# Patient Record
Sex: Male | Born: 1950 | Race: White | Hispanic: No | Marital: Married | Smoking: Never smoker
Health system: Southern US, Community
[De-identification: ages and names within clinical notes are randomized; demographics above are authoritative.]

## PROBLEM LIST (undated history)

## (undated) HISTORY — PX: WRIST SURGERY: SHX841

## (undated) HISTORY — PX: KNEE SURGERY: SHX244

---

## 2008-05-05 ENCOUNTER — Ambulatory Visit (HOSPITAL_BASED_OUTPATIENT_CLINIC_OR_DEPARTMENT_OTHER): Admission: RE | Admit: 2008-05-05 | Discharge: 2008-05-05 | Payer: Self-pay | Admitting: Orthopedic Surgery

## 2008-05-05 ENCOUNTER — Ambulatory Visit: Payer: Self-pay | Admitting: Diagnostic Radiology

## 2008-05-12 ENCOUNTER — Ambulatory Visit (HOSPITAL_BASED_OUTPATIENT_CLINIC_OR_DEPARTMENT_OTHER): Admission: RE | Admit: 2008-05-12 | Discharge: 2008-05-12 | Payer: Self-pay | Admitting: Orthopedic Surgery

## 2008-05-12 ENCOUNTER — Ambulatory Visit: Payer: Self-pay | Admitting: Diagnostic Radiology

## 2013-01-13 ENCOUNTER — Ambulatory Visit (INDEPENDENT_AMBULATORY_CARE_PROVIDER_SITE_OTHER): Payer: BC Managed Care – PPO | Admitting: Family Medicine

## 2013-01-13 ENCOUNTER — Encounter: Payer: Self-pay | Admitting: Family Medicine

## 2013-01-13 ENCOUNTER — Ambulatory Visit (HOSPITAL_BASED_OUTPATIENT_CLINIC_OR_DEPARTMENT_OTHER)
Admission: RE | Admit: 2013-01-13 | Discharge: 2013-01-13 | Disposition: A | Discharge status: Home / Self Care | Payer: BC Managed Care – PPO | Source: Ambulatory Visit | Attending: Family Medicine | Admitting: Family Medicine

## 2013-01-13 VITALS — BP 113/75 | HR 79 | Ht 72.0 in | Wt 178.0 lb

## 2013-01-13 DIAGNOSIS — M25519 Pain in unspecified shoulder: Secondary | ICD-10-CM

## 2013-01-13 DIAGNOSIS — M25511 Pain in right shoulder: Secondary | ICD-10-CM

## 2013-01-13 NOTE — Patient Instructions (Addendum)
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 Take tylenol 500mg  1-2 tabs three times a day for pain. Aleve 1-2 tabs twice a day with food OR ibuprofen 600mg  three times a day with food as needed for pain, inflammation. Glucosamine sulfate 750mg  twice a day is a supplement that may help - you take this already. Capsaicin or biofreeze topically up to four times a day may also help with pain. Cortisone injections are an option - let me know if you want to go ahead with one of these. It's important that you continue to stay active. Consider physical therapy to strengthen muscles around the joint that hurts to take pressure off of the joint itself - this is typically not very helpful for the [facility] joint though. Heat or ice 15 minutes at a time 3-4 times a day as needed to help with pain. Follow up with me as needed.

## 2013-01-14 ENCOUNTER — Encounter: Payer: Self-pay | Admitting: Family Medicine

## 2013-01-14 DIAGNOSIS — M25511 Pain in right shoulder: Secondary | ICD-10-CM | POA: Insufficient documentation

## 2013-01-14 NOTE — Progress Notes (Signed)
Patient ID: Dillon Ward, male   DOB: 04-23-1950, 62 y.o.   MRN: [id number]  PCP: No primary provider on file.  Subjective:   HPI: Patient is a 62 y.o. male here for right shoulder pain.  Patient believes pain started back in June when he was swinging an axe. Worsened following playing golf. Sore and feels distended at point of shoulder superiorly. No pain while he was playing golf though. No night pain. No prior shoulder issues. Is right handed. Doesn't do overhead activities including bench press, [facility].  History reviewed. No pertinent past medical history.  No current outpatient prescriptions on file prior to visit.   No current facility-administered medications on file prior to visit.    Past Surgical History  Procedure Laterality Date  . Knee surgery Right     arthroscopy  . Wrist surgery Left     No Known Allergies  History   Social History  . Marital Status: Married    Spouse Name: N/A    Number of Children: N/A  . Years of Education: N/A   Occupational History  . Not on file.   Social History Main Topics  . Smoking status: Never Smoker   . Smokeless tobacco: Not on file  . Alcohol Use: Not on file  . Drug Use: Not on file  . Sexual Activity: Not on file   Other Topics Concern  . Not on file   Social History Narrative  . No narrative on file    Family History  Problem Relation Age of Onset  . Diabetes Mother   . Hypertension Mother   . Hypertension Father   . Diabetes Sister   . Hyperlipidemia Sister   . Heart attack Neg Hx   . Sudden death Neg Hx     BP 113/75  Pulse 79  Ht 6' (1.829 m)  Wt 178 lb (80.74 kg)  BMI 24.14 kg/m2  Review of Systems: See HPI above.    Objective:  Physical Exam:  Gen: NAD  Right shoulder: Focal bony prominence at [facility] joint/possibly just medial to this.  No other swelling, bruising deformity. Mild TTP AC joint, distal clavicle.  No other TTP. FROM. Negative Hawkins, Neers. Negative  Speeds, Yergasons. Strength 5/5 with empty can and resisted internal/external rotation. Negative apprehension. Pain with crossover adduction. NV intact distally.    Assessment & Plan:  1. Right shoulder pain - radiographs show spur/AC DJD but otherwise no acute findings.  No evidence prior high grade AC sprain, fracture.  Discussed options.  He would like to do conservative management.  Takes glucosamine.  Discussed capsaicin, tylenol, nsiads.  Icing as needed.  Relative rest.  Consider injection if pain becomes severe enough.  Otherwise f/u prn.

## 2013-01-14 NOTE — Assessment & Plan Note (Signed)
radiographs show spur/AC DJD but otherwise no acute findings.  No evidence prior high grade AC sprain, fracture.  Discussed options.  He would like to do conservative management.  Takes glucosamine.  Discussed capsaicin, tylenol, nsiads.  Icing as needed.  Relative rest.  Consider injection if pain becomes severe enough.  Otherwise f/u prn.

## 2014-04-14 IMAGING — CR DG SHOULDER 2+V*R*
3 series · 3 of 3 positions shown · non-contrast
Comparison: None.

CLINICAL DATA: Pain and swelling following exertion

EXAM:
RIGHT SHOULDER - 2+ VIEW

[w shoulder ap internal righ]
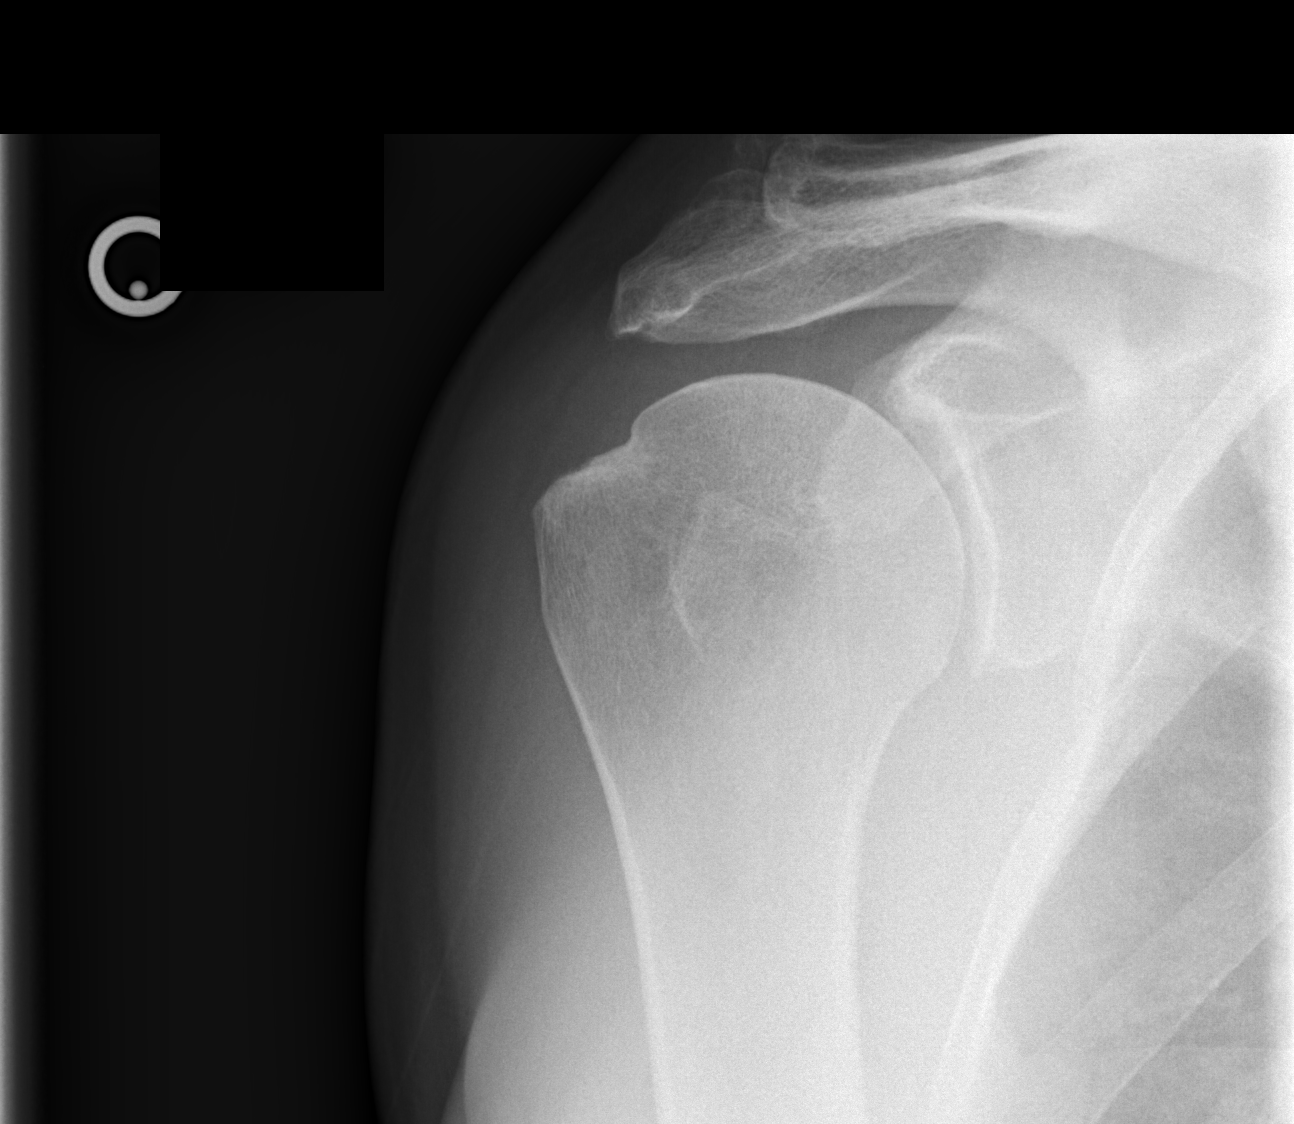

[w shoulder y view right]
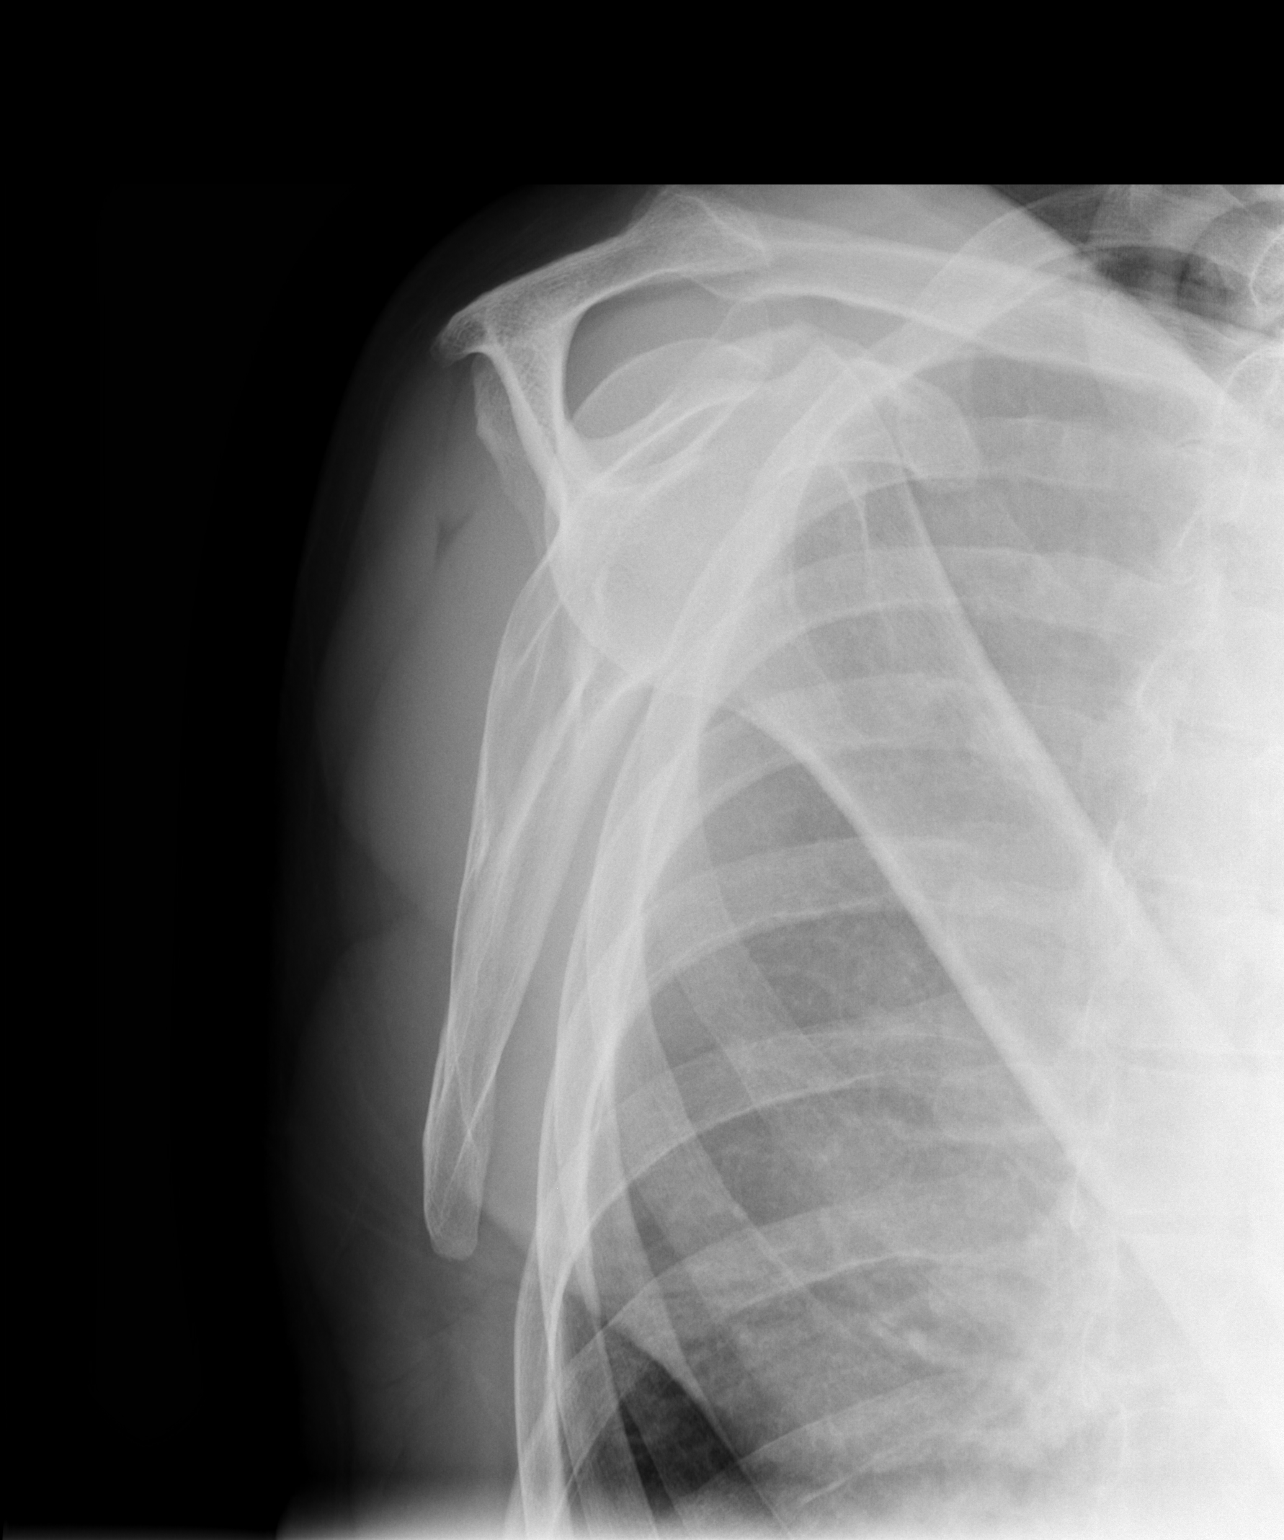

[x shoulder axillary right]
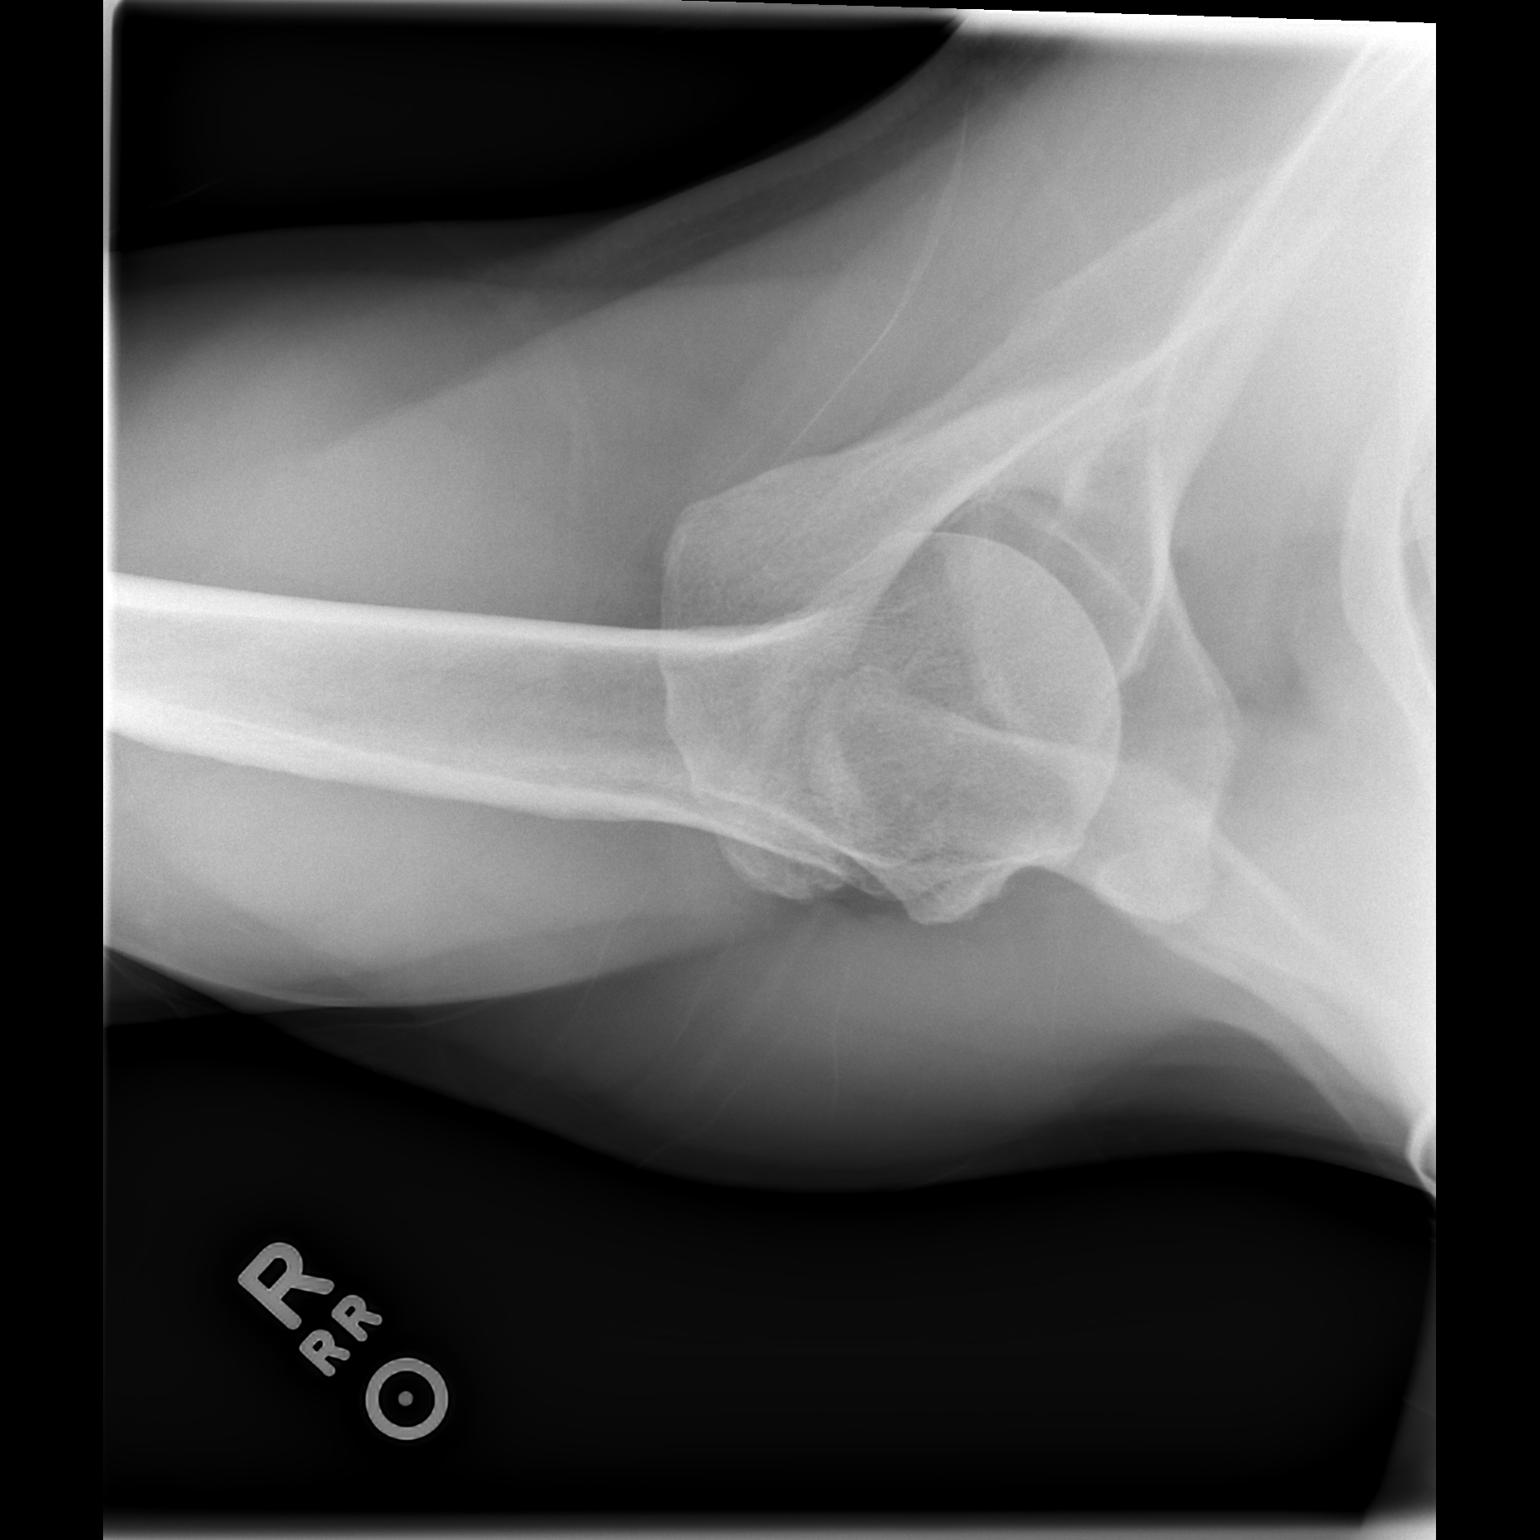

[3 of 3 positions shown; findings below may reference images not displayed]

FINDINGS: No acute fracture or dislocation is noted. Mild degenerative changes
of the acromioclavicular joint are seen. The visualized bony thorax
is unremarkable.
IMPRESSION: Mild degenerative change without acute abnormality.

## 2020-04-26 ENCOUNTER — Other Ambulatory Visit: Payer: Self-pay

## 2020-04-26 ENCOUNTER — Encounter: Payer: Self-pay | Admitting: Physical Therapy

## 2020-04-26 ENCOUNTER — Ambulatory Visit: Payer: Medicare HMO | Attending: Physician Assistant | Admitting: Physical Therapy

## 2020-04-26 DIAGNOSIS — M5442 Lumbago with sciatica, left side: Secondary | ICD-10-CM | POA: Diagnosis present

## 2020-04-26 DIAGNOSIS — R262 Difficulty in walking, not elsewhere classified: Secondary | ICD-10-CM | POA: Diagnosis present

## 2020-04-26 DIAGNOSIS — M25662 Stiffness of left knee, not elsewhere classified: Secondary | ICD-10-CM | POA: Insufficient documentation

## 2020-04-26 DIAGNOSIS — M25562 Pain in left knee: Secondary | ICD-10-CM | POA: Insufficient documentation

## 2020-04-26 NOTE — Therapy (Signed)
[facility] [address] [place], [place], [address] Phone: [phone]   Fax:  [phone]  Physical Therapy Evaluation  Patient Details  Name: Dillon Ward MRN: [id number] Date of Birth: 03/01/1951 Referring Provider (PT): Chabon   Encounter Date: 04/26/2020   PT End of Session - 04/26/20 1046    Visit Number 1    Date for PT Re-Evaluation 06/24/20    PT Start Time 1002    PT Stop Time 1043    PT Time Calculation (min) 41 min    Activity Tolerance Patient tolerated treatment well           History reviewed. No pertinent past medical history.  Past Surgical History:  Procedure Laterality Date  . KNEE SURGERY Right    arthroscopy  . WRIST SURGERY Left     There were no vitals filed for this visit.    Subjective Assessment - 04/26/20 1006    Subjective Patient reports that he has had some knee issues from playing basketball.  Reports that a few years ago he had an issue with the left knee where it would not straighten out.  He reports that he has difficulty straightening the knee, reports issues recently with activity.  X-rays showed OA.  He reports recently he was using post hole diggers and caused back pain and hip pain and this has made the knee pain a little worse.    Limitations Lifting;Walking;House hold activities    Patient Stated Goals better ROM, less pain, easier movements    Currently in Pain? Yes    Pain Score 4     Pain Location Back    Pain Orientation Lower;Left    Pain Descriptors / Indicators Aching;Sore    Pain Type Acute pain    Pain Radiating Towards has pain in the lateral anterior left thigh reports some tingling    Pain Onset More than a month ago    Pain Frequency Intermittent    Aggravating Factors  in the morning, twisint motions, lifting, pain up to 7/10    Pain Relieving Factors Aleve, rest, some stretches pain can be down to a 1/10    Effect of Pain on Daily Activities limits  activity, pain, tightness              [facility] PT Assessment - 04/26/20 0001      Assessment   Medical Diagnosis LBP, left hip and left knee pain    Referring Provider (PT) Chabon    Prior Therapy no      Precautions   Precautions None      Balance Screen   Has the patient fallen in the past 6 months No    Has the patient had a decrease in activity level because of a fear of falling?  No    Is the patient reluctant to leave their home because of a fear of falling?  No      Home Environment   Additional Comments has stairs, does housework, does yardwork      Prior Function   Level of Independence Independent    Vocation Retired    Environmental manager    Leisure walk for activity, gym cardio, golf 1x/week      Posture/Postural Control   Posture Comments decreased lordosis, slight forward flexed trunk with standing, rounded shoulders      ROM / Strength   AROM / PROM / Strength AROM;Strength;PROM      AROM  Overall AROM Comments lumbar ROM WNL's for flexion and right side bending, decreased 50% with pain for extension and left side bending    AROM Assessment Site Knee    Right/Left Knee Left    Left Knee Extension 22    Left Knee Flexion 110      PROM   PROM Assessment Site Knee    Right/Left Knee Left    Left Knee Extension 12      Strength   Overall Strength Comments WFL's, left knee extension 4-/5      Flexibility   Soft Tissue Assessment /Muscle Length yes    Hamstrings tight, very tight left    ITB very tight    Piriformis very tight iwth pain      Palpation   Palpation comment tender in the left SI, mild tenderness in the left hip, very tight posterior knee      Ambulation/Gait   Gait Comments no device, seems to have varus of the left knee, cannot extend the left knee, antalgic on the left                      Objective measurements completed on examination: See above findings.                 PT Short  Term Goals - 04/26/20 1307      PT SHORT TERM GOAL #1   Title independent with initial HEP    Time 2    Period Weeks    Status New             PT Long Term Goals - 04/26/20 1307      PT LONG TERM GOAL #1   Title understand posture and body mechanics    Time 8    Period Weeks    Status New      PT LONG TERM GOAL #2   Title report pain overall decreased 50%    Time 8    Period Weeks    Status New      PT LONG TERM GOAL #3   Title increase left knee extension by 10 degrees    Time 8    Period Weeks    Status New      PT LONG TERM GOAL #4   Title report safe with gym or advanced HEP    Period Weeks    Status New                  Plan - 04/26/20 1052    Clinical Impression Statement Patient reports that he has had a few years of knee pain, reports that a few months ago he was using post hole diggers and hurt his back, he started having pain in the left leg and hip, he report that he has had issues with inability to extend the left knee for years, reports that with this back issue this got worse.  He reports difficulty walking, has pain in the left lumbar area and the left hip and lateral thigh, he has about 20 degrees missing for left knee extension.  He is very tight HS and piriformis.  Has some limitaiton in lumbar ROM with pain    Stability/Clinical Decision Making Stable/Uncomplicated    Clinical Decision Making Low    Rehab Potential Good    PT Frequency 2x / week    PT Duration 8 weeks    PT Treatment/Interventions ADLs/Self Care Home Management;Cryotherapy;Electrical Stimulation;Moist Heat;Traction;Ultrasound;Gait training;Neuromuscular re-education;Balance training;Therapeutic  exercise;Therapeutic activities;Functional mobility training;Stair training;Patient/family education;Manual techniques    PT Next Visit Plan start flexibiltiy and stability, work on knee extension without pushing too much    Consulted and Agree with Plan of Care Patient            Patient will benefit from skilled therapeutic intervention in order to improve the following deficits and impairments:  Decreased range of motion,Difficulty walking,Increased muscle spasms,Pain,Impaired flexibility,Postural dysfunction,Decreased mobility,Improper body mechanics  Visit Diagnosis: Acute pain of left knee - Plan: PT plan of care cert/re-cert  Stiffness of left knee, not elsewhere classified - Plan: PT plan of care cert/re-cert  Difficulty in walking, not elsewhere classified - Plan: PT plan of care cert/re-cert  Acute left-sided low back pain with left-sided sciatica - Plan: PT plan of care cert/re-cert     Problem List Patient Active Problem List   Diagnosis Date Noted  . Right shoulder pain 01/14/2013    Jearld Lesch., PT 04/26/2020, 1:10 PM  [facility] [address] [place], [place], [address] Phone: [phone]   Fax:  [phone]  Name: Dillon Ward MRN: [id number] Date of Birth: 03-Apr-1951

## 2020-04-26 NOTE — Patient Instructions (Signed)
Access Code: [email or web address] URL: [email or web address] Date: 04/26/2020 Prepared by: Stacie Glaze  Exercises Hooklying Single Knee to Chest Stretch - 2 x daily - 7 x weekly - 1 sets - 10 reps - 10 hold Supine Double Knee to Chest - 2 x daily - 7 x weekly - 1 sets - 10 reps - 10 hold Supine Lower Trunk Rotation - 2 x daily - 7 x weekly - 1 sets - 10 reps - 10 hold Supine Piriformis Stretch Pulling Heel to Hip - 2 x daily - 7 x weekly - 1 sets - 10 reps - 10 hold Seated Hamstring Stretch with Chair - 2 x daily - 7 x weekly - 1 sets - 10 reps - 30 hold

## 2020-04-28 ENCOUNTER — Other Ambulatory Visit: Payer: Self-pay

## 2020-04-28 ENCOUNTER — Ambulatory Visit: Payer: Medicare HMO | Admitting: Physical Therapy

## 2020-04-28 ENCOUNTER — Encounter: Payer: Self-pay | Admitting: Physical Therapy

## 2020-04-28 DIAGNOSIS — M5442 Lumbago with sciatica, left side: Secondary | ICD-10-CM

## 2020-04-28 DIAGNOSIS — M25562 Pain in left knee: Secondary | ICD-10-CM | POA: Diagnosis not present

## 2020-04-28 DIAGNOSIS — M25662 Stiffness of left knee, not elsewhere classified: Secondary | ICD-10-CM

## 2020-04-28 DIAGNOSIS — R262 Difficulty in walking, not elsewhere classified: Secondary | ICD-10-CM

## 2020-04-28 NOTE — Therapy (Signed)
[facility] [place] [facility] [facility] [address] [place], [place], [address] Phone: [phone]   Fax:  [phone]  Physical Therapy Treatment  Patient Details  Name: Dillon Ward MRN: [id number] Date of Birth: [birth year] Referring Provider (PT): Chabon   Encounter Date: 04/28/2020   PT End of Session - 04/28/20 1517    Visit Number 2    Date for PT Re-Evaluation 06/24/20    PT Start Time 1426    PT Stop Time 1515    PT Time Calculation (min) 49 min    Activity Tolerance Patient tolerated treatment well    Behavior During Therapy [facility] for tasks assessed/performed           History reviewed. No pertinent past medical history.  Past Surgical History:  Procedure Laterality Date  . KNEE SURGERY Right    arthroscopy  . WRIST SURGERY Left     There were no vitals filed for this visit.   Subjective Assessment - 04/28/20 1426    Subjective Feeling pretty good today, back pain is almost gone.    Currently in Pain? Yes    Pain Score 1     Pain Location Knee    Pain Orientation Left                             [facility] Adult PT Treatment/Exercise - 04/28/20 0001      Exercises   Exercises Knee/Hip      Knee/Hip Exercises: Stretches   Passive Hamstring Stretch Left;20 seconds;5 reps;30 seconds    ITB Stretch Left;3 reps;10 seconds;20 seconds    Piriformis Stretch Left;4 reps;10 seconds;20 seconds      Knee/Hip Exercises: Aerobic   Recumbent Bike L 2.3 x6 min      Knee/Hip Exercises: Machines for Strengthening   Cybex Leg Press 40lb 2x10      Knee/Hip Exercises: Standing   Forward Step Up Both;1 set;10 reps;Hand Hold: 0;Step Height: 6"    Walking with Sports Cord 40lb 4 way x 3 each                    PT Short Term Goals - 04/28/20 1518      PT SHORT TERM GOAL #1   Title independent with initial HEP    Status Achieved             PT Long Term Goals - 04/26/20 1307      PT LONG TERM GOAL #1    Title understand posture and body mechanics    Time 8    Period Weeks    Status New      PT LONG TERM GOAL #2   Title report pain overall decreased 50%    Time 8    Period Weeks    Status New      PT LONG TERM GOAL #3   Title increase left knee extension by 10 degrees    Time 8    Period Weeks    Status New      PT LONG TERM GOAL #4   Title report safe with gym or advanced HEP    Period Weeks    Status New                 Plan - 04/28/20 1518    Clinical Impression Statement Pt tolerated an initial  progression to TE well, some L hip tightness and instability noted with resisted side  steps. Pt has very tight hamstrings possibly making it difficult to fully extend L knee. Some LLE weakness noted with step ups. L piriformis muscle was tight as well.    Stability/Clinical Decision Making Stable/Uncomplicated    Rehab Potential Good    PT Frequency 2x / week    PT Duration 8 weeks    PT Treatment/Interventions ADLs/Self Care Home Management;Cryotherapy;Electrical Stimulation;Moist Heat;Traction;Ultrasound;Gait training;Neuromuscular re-education;Balance training;Therapeutic exercise;Therapeutic activities;Functional mobility training;Stair training;Patient/family education;Manual techniques    PT Next Visit Plan start flexibility and stability, work on knee extension without pushing too much           Patient will benefit from skilled therapeutic intervention in order to improve the following deficits and impairments:  Decreased range of motion,Difficulty walking,Increased muscle spasms,Pain,Impaired flexibility,Postural dysfunction,Decreased mobility,Improper body mechanics  Visit Diagnosis: Acute pain of left knee  Stiffness of left knee, not elsewhere classified  Difficulty in walking, not elsewhere classified  Acute left-sided low back pain with left-sided sciatica     Problem List Patient Active Problem List   Diagnosis Date Noted  . Right shoulder pain  01/14/2013    Grayce Sessions, PTA 04/28/2020, 3:26 PM  [facility] [place] [address] [place], [place], [address] Phone: [phone]   Fax:  [phone]  Name: Rutherford Alarie MRN: [id number] Date of Birth: May 26, 1950

## 2020-05-02 ENCOUNTER — Ambulatory Visit: Payer: Medicare HMO | Admitting: Physical Therapy

## 2020-05-04 ENCOUNTER — Other Ambulatory Visit: Payer: Self-pay

## 2020-05-04 ENCOUNTER — Encounter: Payer: Self-pay | Admitting: Physical Therapy

## 2020-05-04 ENCOUNTER — Ambulatory Visit: Payer: Medicare HMO | Admitting: Physical Therapy

## 2020-05-04 DIAGNOSIS — M25562 Pain in left knee: Secondary | ICD-10-CM | POA: Diagnosis not present

## 2020-05-04 DIAGNOSIS — M25662 Stiffness of left knee, not elsewhere classified: Secondary | ICD-10-CM

## 2020-05-04 DIAGNOSIS — R262 Difficulty in walking, not elsewhere classified: Secondary | ICD-10-CM

## 2020-05-04 DIAGNOSIS — M5442 Lumbago with sciatica, left side: Secondary | ICD-10-CM

## 2020-05-04 NOTE — Therapy (Signed)
[place]. [place], [place], [address] Phone: [phone]   Fax:  [phone]  Physical Therapy Treatment  Patient Details  Name: Dillon Ward MRN: [id number] Date of Birth: Sep 13, 1950 Referring Provider (PT): Chabon   Encounter Date: 05/04/2020   PT End of Session - 05/04/20 1141    Visit Number 3    Date for PT Re-Evaluation 06/24/20    PT Start Time 1100    PT Stop Time 1145    PT Time Calculation (min) 45 min    Activity Tolerance Patient tolerated treatment well    Behavior During Therapy [facility] for tasks assessed/performed           History reviewed. No pertinent past medical history.  Past Surgical History:  Procedure Laterality Date  . KNEE SURGERY Right    arthroscopy  . WRIST SURGERY Left     There were no vitals filed for this visit.   Subjective Assessment - 05/04/20 1101    Subjective "Im all right" Biggest issues is sleeping at night getting comfortable. The leg is uncomfortable    Currently in Pain? No/denies                             [facility] Adult PT Treatment/Exercise - 05/04/20 0001      Exercises   Exercises Lumbar      Lumbar Exercises: Machines for Strengthening   Other Lumbar Machine Exercise Rows & Lats 35lb 2x10    Cybex Leg Press 40lb 2x15, LLE 20lb 2x10      Lumbar Exercises: Standing   Shoulder Extension 20 reps;Power Tower;Strengthening;Both    Shoulder Extension Limitations 10    Other Standing Lumbar Exercises AR press 20lb x10 each    Other Standing Lumbar Exercises Overhead Ext yellow ball 2x10      Lumbar Exercises: Supine   Other Supine Lumbar Exercises LE on Pball bidges, K2C      Knee/Hip Exercises: Aerobic   Recumbent Bike L 4 x6 min      Knee/Hip Exercises: Standing   Lateral Step Up Left;10 reps;1 set;Hand Hold: 0;Step Height: 8"                    PT Short Term Goals - 04/28/20 1518      PT SHORT TERM GOAL #1    Title independent with initial HEP    Status Achieved             PT Long Term Goals - 05/04/20 1142      PT LONG TERM GOAL #1   Title understand posture and body mechanics    Status Partially Met      PT LONG TERM GOAL #2   Title report pain overall decreased 50%    Status Partially Met                 Plan - 05/04/20 1142    Clinical Impression Statement Pt did well with a progressed session. Added more postural strengthening interventions. Tactile cues needed with seated rows and standing shoulder ext to maintain good posture. Pt did reports some low back tightness on L side with overhead extensions. Some core weakness noted with anti rotational presses.    Stability/Clinical Decision Making Stable/Uncomplicated    Rehab Potential Good    PT Frequency 2x / week    PT Treatment/Interventions ADLs/Self Care Home Management;Cryotherapy;Electrical Stimulation;Moist Heat;Traction;Ultrasound;Gait training;Neuromuscular re-education;Balance training;Therapeutic exercise;Therapeutic  activities;Functional mobility training;Stair training;Patient/family education;Manual techniques    PT Next Visit Plan start flexibiltiy and stability, work on knee extension without pushing too much           Patient will benefit from skilled therapeutic intervention in order to improve the following deficits and impairments:  Decreased range of motion,Difficulty walking,Increased muscle spasms,Pain,Impaired flexibility,Postural dysfunction,Decreased mobility,Improper body mechanics  Visit Diagnosis: Stiffness of left knee, not elsewhere classified  Difficulty in walking, not elsewhere classified  Acute pain of left knee  Acute left-sided low back pain with left-sided sciatica     Problem List Patient Active Problem List   Diagnosis Date Noted  . Right shoulder pain 01/14/2013    Scot Jun, PTA 05/04/2020, 11:45 AM  [address]. [place], [place], [address] Phone: [phone]   Fax:  [phone]  Name: Dillon Ward MRN: [id number] Date of Birth: 1950/06/17

## 2020-05-09 ENCOUNTER — Ambulatory Visit: Payer: Medicare HMO | Admitting: Physical Therapy

## 2020-05-09 ENCOUNTER — Other Ambulatory Visit: Payer: Self-pay

## 2020-05-09 ENCOUNTER — Encounter: Payer: Self-pay | Admitting: Physical Therapy

## 2020-05-09 DIAGNOSIS — M25662 Stiffness of left knee, not elsewhere classified: Secondary | ICD-10-CM

## 2020-05-09 DIAGNOSIS — M25562 Pain in left knee: Secondary | ICD-10-CM | POA: Diagnosis not present

## 2020-05-09 DIAGNOSIS — R262 Difficulty in walking, not elsewhere classified: Secondary | ICD-10-CM

## 2020-05-09 DIAGNOSIS — M5442 Lumbago with sciatica, left side: Secondary | ICD-10-CM

## 2020-05-09 NOTE — Therapy (Signed)
[place] [address] [place], [place], [address] Phone: [phone]   Fax:  [phone]  Physical Therapy Treatment  Patient Details  Name: Dillon Ward MRN: [id number] Date of Birth: [birth year] Referring Provider (PT): Chabon   Encounter Date: 05/09/2020   PT End of Session - 05/09/20 1341    Visit Number 4    Date for PT Re-Evaluation 06/24/20    PT Start Time 1300    PT Stop Time 1345    PT Time Calculation (min) 45 min    Activity Tolerance Patient tolerated treatment well    Behavior During Therapy [facility] for tasks assessed/performed           History reviewed. No pertinent past medical history.  Past Surgical History:  Procedure Laterality Date  . KNEE SURGERY Right    arthroscopy  . WRIST SURGERY Left     There were no vitals filed for this visit.   Subjective Assessment - 05/09/20 1300    Subjective Feeling good, wearing a back brace  at night, does not feel the tug on the L side when he wakes ups    Currently in Pain? No/denies                             [facility] Adult PT Treatment/Exercise - 05/09/20 0001      Lumbar Exercises: Machines for Strengthening   Other Lumbar Machine Exercise Rows & Lats 35lb 2x10      Lumbar Exercises: Standing   Shoulder Extension 20 reps;Power Tower;Strengthening;Both    Shoulder Extension Limitations 10    Other Standing Lumbar Exercises AR press 20lb x10 each    Other Standing Lumbar Exercises Overhead Ext yellow ball 2x10      Knee/Hip Exercises: Aerobic   Recumbent Bike L 4 x6 min      Knee/Hip Exercises: Machines for Strengthening   Cybex Leg Press 40lb 2x15, LLE 20lb 2x10      Knee/Hip Exercises: Standing   Other Standing Knee Exercises LLE eccentric step downs 6in 2x10                    PT Short Term Goals - 04/28/20 1518      PT SHORT TERM GOAL #1   Title independent with initial HEP    Status Achieved              PT Long Term Goals - 05/04/20 1142      PT LONG TERM GOAL #1   Title understand posture and body mechanics    Status Partially Met      PT LONG TERM GOAL #2   Title report pain overall decreased 50%    Status Partially Met                 Plan - 05/09/20 1342    Clinical Impression Statement Good carryover from last session with postural strengthening. Postural cues needed with seated rows and standing shoulder extensions. Added some LEE isolation strengthening without issues. Core weakness remains with anti rotational presses.    Stability/Clinical Decision Making Stable/Uncomplicated    Rehab Potential Good    PT Frequency 2x / week    PT Duration 8 weeks    PT Treatment/Interventions ADLs/Self Care Home Management;Cryotherapy;Electrical Stimulation;Moist Heat;Traction;Ultrasound;Gait training;Neuromuscular re-education;Balance training;Therapeutic exercise;Therapeutic activities;Functional mobility training;Stair training;Patient/family education;Manual techniques    PT Next Visit Plan start flexibiltiy and stability, work on knee extension  without pushing too much           Patient will benefit from skilled therapeutic intervention in order to improve the following deficits and impairments:  Decreased range of motion,Difficulty walking,Increased muscle spasms,Pain,Impaired flexibility,Postural dysfunction,Decreased mobility,Improper body mechanics  Visit Diagnosis: Acute pain of left knee  Difficulty in walking, not elsewhere classified  Stiffness of left knee, not elsewhere classified  Acute left-sided low back pain with left-sided sciatica     Problem List Patient Active Problem List   Diagnosis Date Noted  . Right shoulder pain 01/14/2013    Scot Jun, PTA 05/09/2020, 1:44 PM  [place] [address]. [place], [place], [address] Phone: [phone]   Fax:  [phone]  Name: Dandra Velardi MRN: [id number] Date of Birth: 04-Aug-1950

## 2020-05-11 ENCOUNTER — Other Ambulatory Visit: Payer: Self-pay

## 2020-05-11 ENCOUNTER — Ambulatory Visit: Payer: Medicare HMO | Admitting: Physical Therapy

## 2020-05-11 ENCOUNTER — Encounter: Payer: Self-pay | Admitting: Physical Therapy

## 2020-05-11 DIAGNOSIS — M25562 Pain in left knee: Secondary | ICD-10-CM | POA: Diagnosis not present

## 2020-05-11 DIAGNOSIS — R262 Difficulty in walking, not elsewhere classified: Secondary | ICD-10-CM

## 2020-05-11 DIAGNOSIS — M25662 Stiffness of left knee, not elsewhere classified: Secondary | ICD-10-CM

## 2020-05-11 NOTE — Therapy (Signed)
[address]. [place], [place], [address] Phone: [phone]   Fax:  [phone]  Physical Therapy Treatment  Patient Details  Name: Dillon Ward MRN: [id number] Date of Birth: 30-Jun-1950 Referring Provider (PT): Chabon   Encounter Date: 05/11/2020   PT End of Session - 05/11/20 1351    Visit Number 5    Date for PT Re-Evaluation 06/24/20    PT Start Time 1300    PT Stop Time 1348    PT Time Calculation (min) 48 min    Activity Tolerance Patient tolerated treatment well    Behavior During Therapy [facility] for tasks assessed/performed           History reviewed. No pertinent past medical history.  Past Surgical History:  Procedure Laterality Date  . KNEE SURGERY Right    arthroscopy  . WRIST SURGERY Left     There were no vitals filed for this visit.   Subjective Assessment - 05/11/20 1301    Subjective sleeping better with back brace on. L side feels ok    Currently in Pain? No/denies              [facility] PT Assessment - 05/11/20 0001      AROM   Right/Left Knee Left    Left Knee Extension 9    Left Knee Flexion 117                         [facility] Adult PT Treatment/Exercise - 05/11/20 0001      Lumbar Exercises: Machines for Strengthening   Other Lumbar Machine Exercise Rows & Lats 45lb 2x10      Knee/Hip Exercises: Aerobic   Recumbent Bike L 4 x6 min    Nustep L5 x 4 min      Knee/Hip Exercises: Machines for Strengthening   Cybex Knee Extension 10lb 2x10    Cybex Knee Flexion 35lb 2x10    Cybex Leg Press 40lb 2x15, LLE 20lb 2x10      Knee/Hip Exercises: Standing   Heel Raises 2 sets;15 reps;2 seconds    Lateral Step Up Left;10 reps;1 set;Hand Hold: 0;Step Height: 8"                    PT Short Term Goals - 04/28/20 1518      PT SHORT TERM GOAL #1   Title independent with initial HEP    Status Achieved             PT Long Term Goals - 05/11/20 1351       PT LONG TERM GOAL #1   Title understand posture and body mechanics    Status Achieved      PT LONG TERM GOAL #2   Title report pain overall decreased 50%    Status Achieved      PT LONG TERM GOAL #3   Title increase left knee extension by 10 degrees    Status Partially Met      PT LONG TERM GOAL #4   Title report safe with gym or advanced HEP    Status Partially Met                 Plan - 05/11/20 1352    Clinical Impression Statement Pt has progressed increasing his L knee extension, he also has met some goals reporting decrease pain overall. Continued with LE and postural strengthening. Cues not pull with UE doing lateral  step ups. Pt also reports some fatigue with lateral step ups.    Stability/Clinical Decision Making Stable/Uncomplicated    Rehab Potential Good    PT Frequency 2x / week    PT Duration 8 weeks    PT Treatment/Interventions ADLs/Self Care Home Management;Cryotherapy;Electrical Stimulation;Moist Heat;Traction;Ultrasound;Gait training;Neuromuscular re-education;Balance training;Therapeutic exercise;Therapeutic activities;Functional mobility training;Stair training;Patient/family education;Manual techniques    PT Next Visit Plan flexibiltiy and stability, work on knee extension without pushing too much           Patient will benefit from skilled therapeutic intervention in order to improve the following deficits and impairments:  Decreased range of motion,Difficulty walking,Increased muscle spasms,Pain,Impaired flexibility,Postural dysfunction,Decreased mobility,Improper body mechanics  Visit Diagnosis: Acute pain of left knee  Difficulty in walking, not elsewhere classified  Stiffness of left knee, not elsewhere classified     Problem List Patient Active Problem List   Diagnosis Date Noted  . Right shoulder pain 01/14/2013    Scot Jun 05/11/2020, 1:58 PM  [address]. [place], [place], [address] Phone: [phone]   Fax:  [phone]  Name: Brandonlee Navis MRN: [id number] Date of Birth: Aug 04, 1950

## 2020-05-16 ENCOUNTER — Ambulatory Visit: Payer: Medicare HMO | Admitting: Physical Therapy

## 2020-05-16 ENCOUNTER — Encounter: Payer: Self-pay | Admitting: Physical Therapy

## 2020-05-16 ENCOUNTER — Other Ambulatory Visit: Payer: Self-pay

## 2020-05-16 DIAGNOSIS — M5442 Lumbago with sciatica, left side: Secondary | ICD-10-CM

## 2020-05-16 DIAGNOSIS — R262 Difficulty in walking, not elsewhere classified: Secondary | ICD-10-CM

## 2020-05-16 DIAGNOSIS — M25562 Pain in left knee: Secondary | ICD-10-CM | POA: Diagnosis not present

## 2020-05-16 DIAGNOSIS — M25662 Stiffness of left knee, not elsewhere classified: Secondary | ICD-10-CM

## 2020-05-16 NOTE — Therapy (Signed)
[address]. [place], [place], [address] Phone: [phone]   Fax:  [phone]  Physical Therapy Treatment  Patient Details  Name: Dillon Ward MRN: [id number] Date of Birth: November 02, 1950 Referring Provider (PT): Chabon   Encounter Date: 05/16/2020   PT End of Session - 05/16/20 1142    Visit Number 6    Date for PT Re-Evaluation 06/24/20    PT Start Time 1100    PT Stop Time 1145    PT Time Calculation (min) 45 min    Activity Tolerance Patient tolerated treatment well    Behavior During Therapy [facility] for tasks assessed/performed           History reviewed. No pertinent past medical history.  Past Surgical History:  Procedure Laterality Date  . KNEE SURGERY Right    arthroscopy  . WRIST SURGERY Left     There were no vitals filed for this visit.   Subjective Assessment - 05/16/20 1059    Subjective Doing pretty good. I am feeling ok    Currently in Pain? No/denies              [facility] PT Assessment - 05/16/20 0001      AROM   Right/Left Knee Left    Left Knee Extension 6                         [facility] Adult PT Treatment/Exercise - 05/16/20 0001      Lumbar Exercises: Machines for Strengthening   Other Lumbar Machine Exercise Rows & Lats 45lb 2x10      Lumbar Exercises: Standing   Shoulder Extension 20 reps;Power Tower;Strengthening;Both    Shoulder Extension Limitations 10    Other Standing Lumbar Exercises LLE eccentric step downs 2x10      Knee/Hip Exercises: Aerobic   Recumbent Bike L 4 x6 min      Knee/Hip Exercises: Machines for Strengthening   Cybex Knee Extension 15lb 2x10    Cybex Knee Flexion 35lb 2x10    Cybex Leg Press 40lb 2x15, LLE 20lb 2x10      Knee/Hip Exercises: Standing   Walking with Sports Cord 30lb side step over foam roll x10 each                    PT Short Term Goals - 04/28/20 1518      PT SHORT TERM GOAL #1   Title  independent with initial HEP    Status Achieved             PT Long Term Goals - 05/16/20 1143      PT LONG TERM GOAL #1   Title understand posture and body mechanics    Status Achieved      PT LONG TERM GOAL #2   Title report pain overall decreased 50%    Status Achieved      PT LONG TERM GOAL #3   Title increase left knee extension by 10 degrees    Status Achieved      PT LONG TERM GOAL #4   Title report safe with gym or advanced HEP    Status Achieved                 Plan - 05/16/20 1144    Clinical Impression Statement Pt has progressed meeting all PT goals. He still lacks some L knee Ext but reports no functional limitations and is pleased with her  current functional status.    Stability/Clinical Decision Making Stable/Uncomplicated    Rehab Potential Good    PT Frequency 2x / week    PT Duration 8 weeks    PT Treatment/Interventions ADLs/Self Care Home Management;Cryotherapy;Electrical Stimulation;Moist Heat;Traction;Ultrasound;Gait training;Neuromuscular re-education;Balance training;Therapeutic exercise;Therapeutic activities;Functional mobility training;Stair training;Patient/family education;Manual techniques    PT Next Visit Plan D/C PT           Patient will benefit from skilled therapeutic intervention in order to improve the following deficits and impairments:  Decreased range of motion,Difficulty walking,Increased muscle spasms,Pain,Impaired flexibility,Postural dysfunction,Decreased mobility,Improper body mechanics  Visit Diagnosis: Acute pain of left knee  Difficulty in walking, not elsewhere classified  Stiffness of left knee, not elsewhere classified  Acute left-sided low back pain with left-sided sciatica     Problem List Patient Active Problem List   Diagnosis Date Noted  . Right shoulder pain 01/14/2013   PHYSICAL THERAPY DISCHARGE SUMMARY  Visits from Start of Care: 6  Plan: Patient agrees to discharge.  Patient goals were  met. Patient is being discharged due to meeting the stated rehab goals.  ?????     Scot Jun 05/16/2020, 11:45 AM  [address]. [place], [place], [address] Phone: [phone]   Fax:  [phone]  Name: Dillon Ward MRN: [id number] Date of Birth: [birth year]
